# Patient Record
Sex: Female | Born: 1995 | Race: White | Hispanic: No | Marital: Single | Smoking: Never smoker
Health system: Southern US, Community
[De-identification: ages and names within clinical notes are randomized; demographics above are authoritative.]

---
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
## 2018-07-14 IMAGING — DX DG CHEST 2V
2 series · 2 of 2 positions shown · non-contrast
Comparison: None.

CLINICAL DATA: Right-sided and central chest pain for 3 days.

EXAM:
CHEST  2 VIEW

[chest pa]
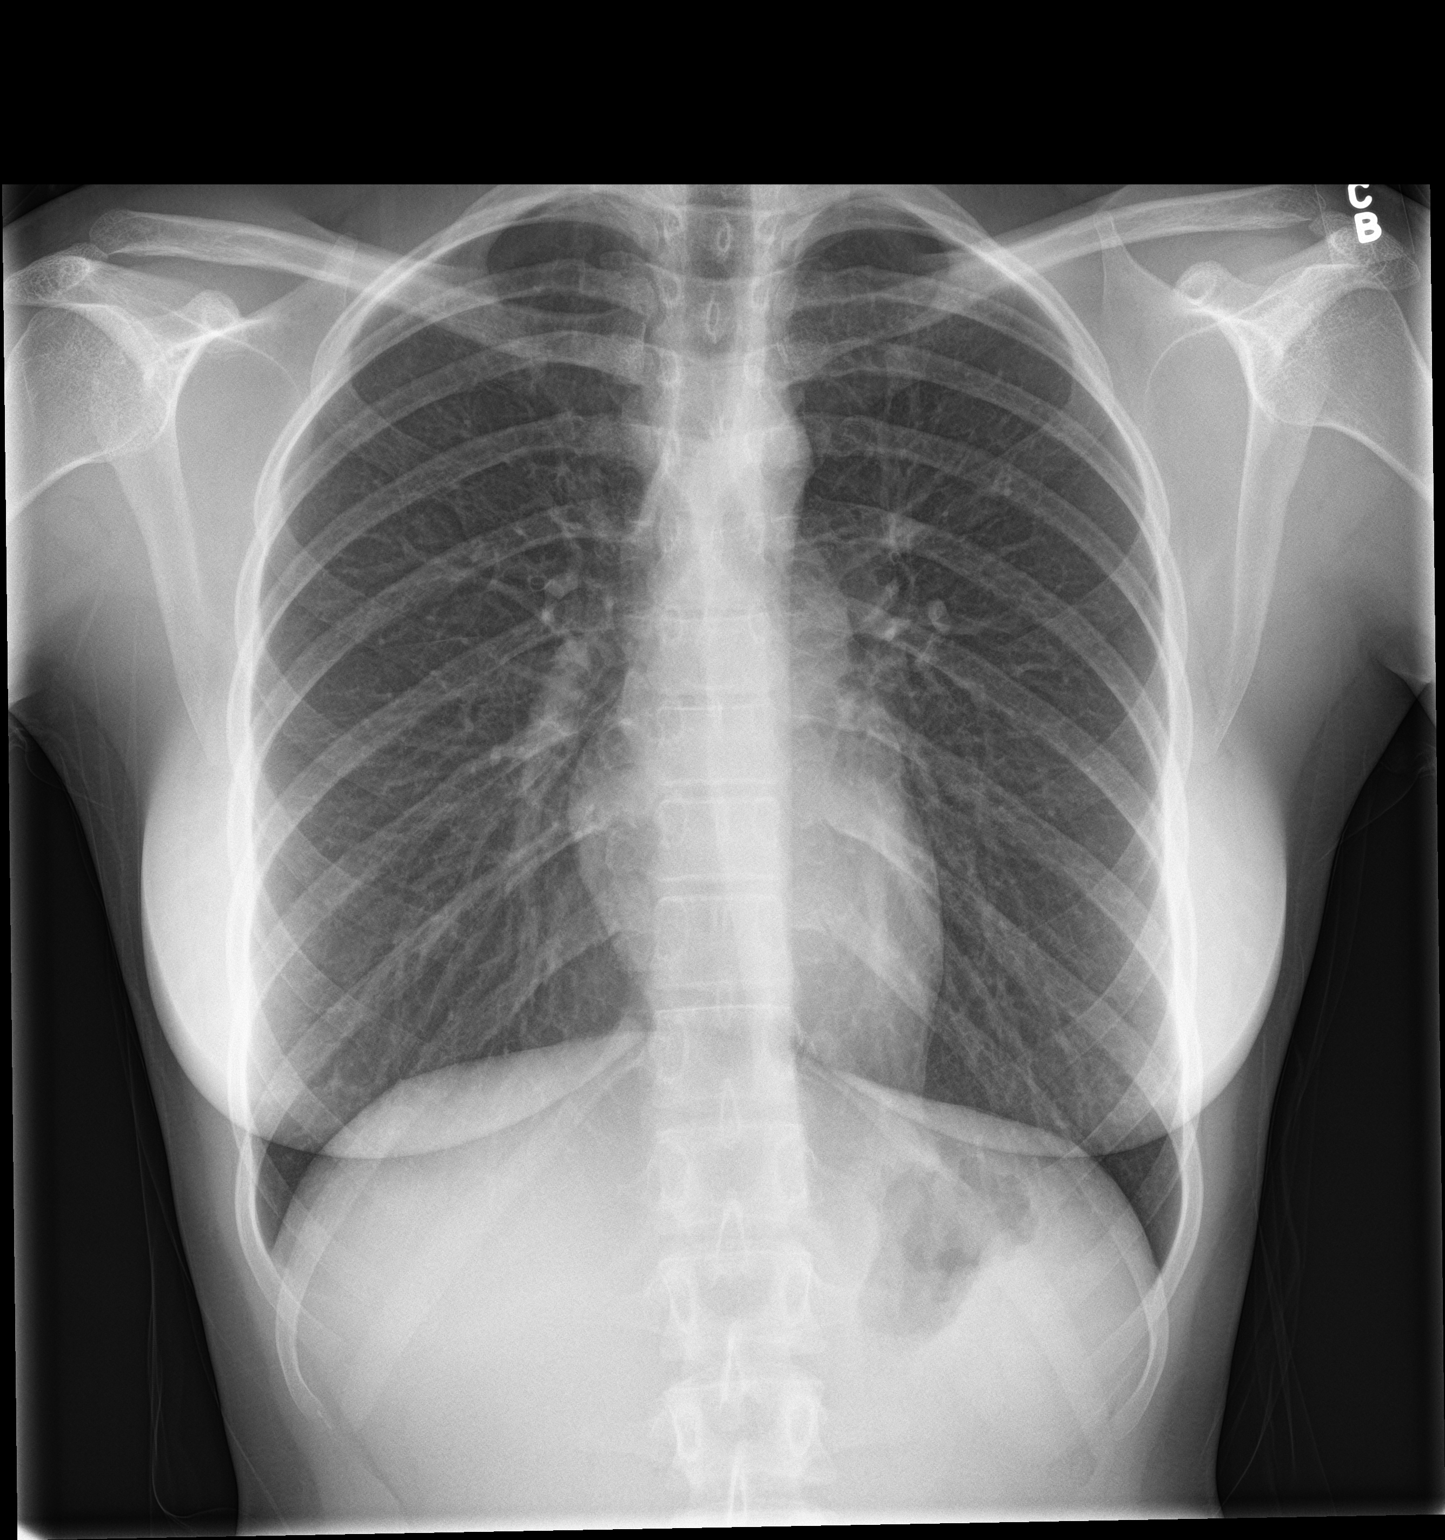

[chest lat]
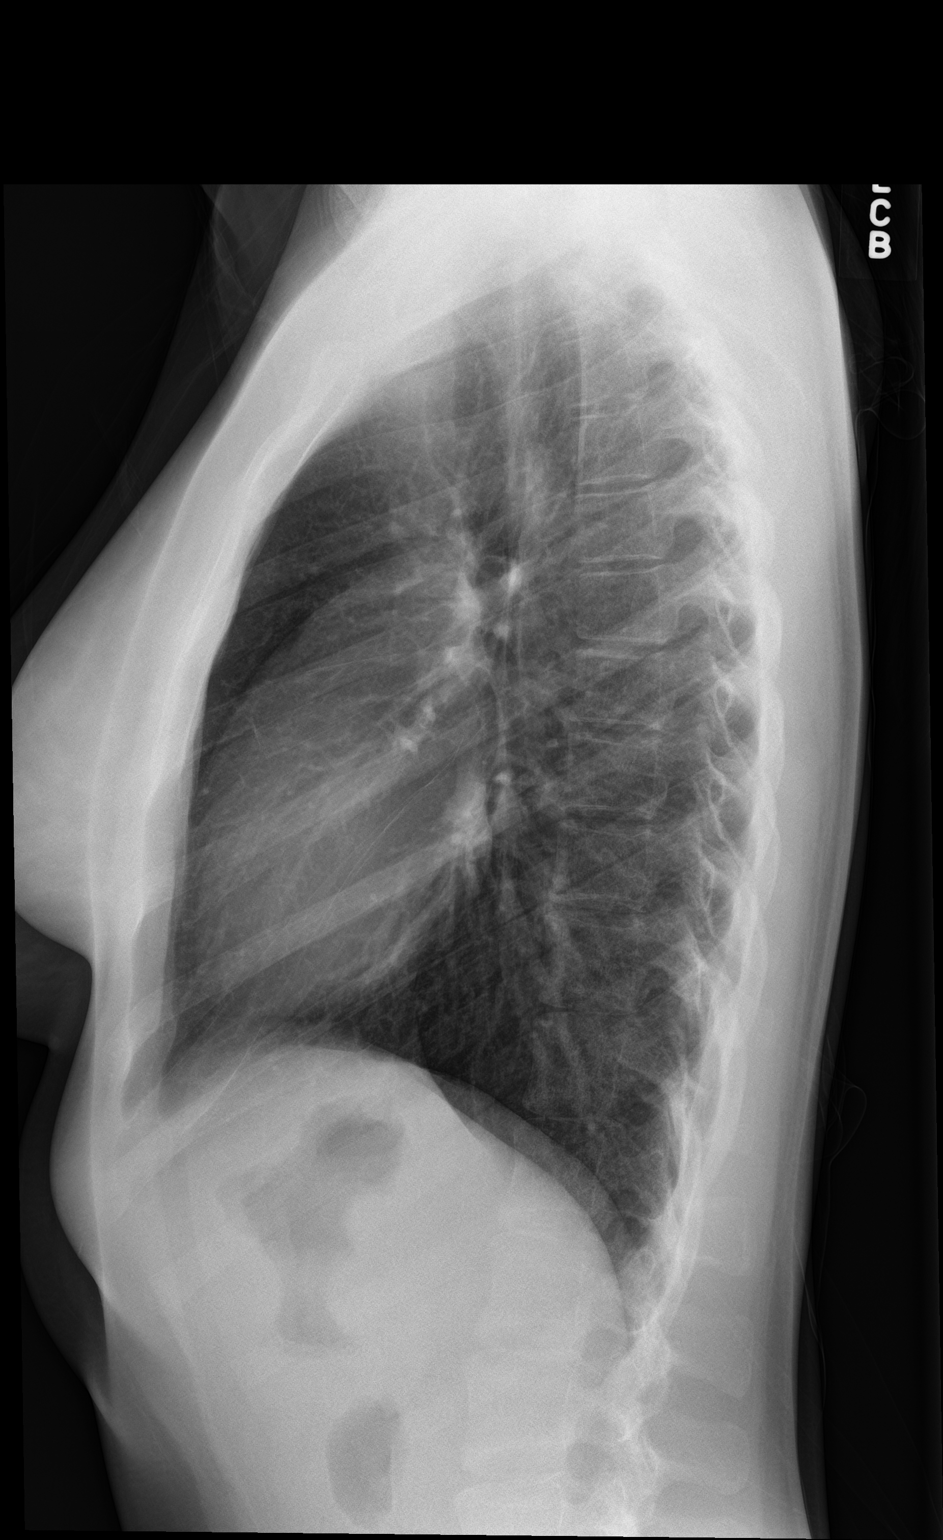

[2 of 2 positions shown; findings below may reference images not displayed]

FINDINGS: The heart size and mediastinal contours are within normal limits.
There is no evidence of pulmonary edema, consolidation,
pneumothorax, nodule or pleural fluid. The visualized skeletal
structures are unremarkable.
IMPRESSION: No active cardiopulmonary disease.
